# Patient Record
Sex: Male | Born: 1965 | Race: White | Hispanic: No | Marital: Married | Smoking: Former smoker
Health system: Southern US, Community
[De-identification: ages and names within clinical notes are randomized; demographics above are authoritative.]

## PROBLEM LIST (undated history)

## (undated) DIAGNOSIS — Z9889 Other specified postprocedural states: Secondary | ICD-10-CM

## (undated) DIAGNOSIS — J45909 Unspecified asthma, uncomplicated: Secondary | ICD-10-CM

## (undated) HISTORY — PX: APPENDECTOMY: SHX54

## (undated) HISTORY — PX: CATARACT EXTRACTION: SUR2

## (undated) HISTORY — PX: KNEE ARTHROSCOPY AND ARTHROTOMY: SUR84

---

## 2003-05-05 HISTORY — PX: HERNIA REPAIR: SHX51

## 2015-01-06 ENCOUNTER — Encounter (HOSPITAL_COMMUNITY)
Admission: EM | Disposition: A | Discharge status: Home / Self Care | Payer: Self-pay | Source: Home / Self Care | Attending: Emergency Medicine

## 2015-01-06 ENCOUNTER — Ambulatory Visit (HOSPITAL_COMMUNITY)
Admission: EM | Admit: 2015-01-06 | Discharge: 2015-01-06 | Disposition: A | Discharge status: Home / Self Care | Payer: Managed Care, Other (non HMO) | Attending: Emergency Medicine | Admitting: Emergency Medicine

## 2015-01-06 ENCOUNTER — Emergency Department (HOSPITAL_COMMUNITY): Payer: Managed Care, Other (non HMO)

## 2015-01-06 ENCOUNTER — Encounter (HOSPITAL_COMMUNITY): Payer: Self-pay | Admitting: Emergency Medicine

## 2015-01-06 DIAGNOSIS — T18128A Food in esophagus causing other injury, initial encounter: Secondary | ICD-10-CM | POA: Insufficient documentation

## 2015-01-06 DIAGNOSIS — R1012 Left upper quadrant pain: Secondary | ICD-10-CM | POA: Diagnosis not present

## 2015-01-06 DIAGNOSIS — K589 Irritable bowel syndrome without diarrhea: Secondary | ICD-10-CM | POA: Insufficient documentation

## 2015-01-06 DIAGNOSIS — J45909 Unspecified asthma, uncomplicated: Secondary | ICD-10-CM | POA: Insufficient documentation

## 2015-01-06 DIAGNOSIS — R0602 Shortness of breath: Secondary | ICD-10-CM | POA: Insufficient documentation

## 2015-01-06 DIAGNOSIS — Z7982 Long term (current) use of aspirin: Secondary | ICD-10-CM | POA: Diagnosis not present

## 2015-01-06 DIAGNOSIS — Z87891 Personal history of nicotine dependence: Secondary | ICD-10-CM | POA: Insufficient documentation

## 2015-01-06 DIAGNOSIS — Z79899 Other long term (current) drug therapy: Secondary | ICD-10-CM | POA: Diagnosis not present

## 2015-01-06 DIAGNOSIS — K222 Esophageal obstruction: Secondary | ICD-10-CM

## 2015-01-06 DIAGNOSIS — Z9889 Other specified postprocedural states: Secondary | ICD-10-CM

## 2015-01-06 DIAGNOSIS — Z880 Allergy status to penicillin: Secondary | ICD-10-CM | POA: Insufficient documentation

## 2015-01-06 DIAGNOSIS — R1310 Dysphagia, unspecified: Secondary | ICD-10-CM | POA: Diagnosis present

## 2015-01-06 DIAGNOSIS — F419 Anxiety disorder, unspecified: Secondary | ICD-10-CM | POA: Diagnosis not present

## 2015-01-06 HISTORY — DX: Other specified postprocedural states: Z98.890

## 2015-01-06 HISTORY — DX: Unspecified asthma, uncomplicated: J45.909

## 2015-01-06 HISTORY — PX: ESOPHAGOGASTRODUODENOSCOPY: SHX5428

## 2015-01-06 LAB — CBC WITH DIFFERENTIAL/PLATELET
BASOS ABS: 0.1 10*3/uL (ref 0.0–0.1)
Basophils Relative: 1 % (ref 0–1)
EOS ABS: 0.2 10*3/uL (ref 0.0–0.7)
EOS PCT: 3 % (ref 0–5)
HCT: 49 % (ref 39.0–52.0)
HEMOGLOBIN: 16.6 g/dL (ref 13.0–17.0)
LYMPHS PCT: 24 % (ref 12–46)
Lymphs Abs: 1.6 10*3/uL (ref 0.7–4.0)
MCH: 30 pg (ref 26.0–34.0)
MCHC: 33.9 g/dL (ref 30.0–36.0)
MCV: 88.6 fL (ref 78.0–100.0)
Monocytes Absolute: 0.9 10*3/uL (ref 0.1–1.0)
Monocytes Relative: 13 % — ABNORMAL HIGH (ref 3–12)
NEUTROS PCT: 59 % (ref 43–77)
Neutro Abs: 4.1 10*3/uL (ref 1.7–7.7)
PLATELETS: 264 10*3/uL (ref 150–400)
RBC: 5.53 MIL/uL (ref 4.22–5.81)
RDW: 13.6 % (ref 11.5–15.5)
WBC: 6.9 10*3/uL (ref 4.0–10.5)

## 2015-01-06 LAB — COMPREHENSIVE METABOLIC PANEL
ALT: 29 U/L (ref 17–63)
AST: 29 U/L (ref 15–41)
Albumin: 3.8 g/dL (ref 3.5–5.0)
Alkaline Phosphatase: 47 U/L (ref 38–126)
Anion gap: 5 (ref 5–15)
BILIRUBIN TOTAL: 0.5 mg/dL (ref 0.3–1.2)
BUN: 10 mg/dL (ref 6–20)
CHLORIDE: 99 mmol/L — AB (ref 101–111)
CO2: 32 mmol/L (ref 22–32)
Calcium: 9.1 mg/dL (ref 8.9–10.3)
Creatinine, Ser: 0.99 mg/dL (ref 0.61–1.24)
Glucose, Bld: 104 mg/dL — ABNORMAL HIGH (ref 65–99)
POTASSIUM: 4.3 mmol/L (ref 3.5–5.1)
Sodium: 136 mmol/L (ref 135–145)
TOTAL PROTEIN: 6.1 g/dL — AB (ref 6.5–8.1)

## 2015-01-06 LAB — LIPASE, BLOOD: LIPASE: 71 U/L — AB (ref 22–51)

## 2015-01-06 SURGERY — EGD (ESOPHAGOGASTRODUODENOSCOPY)
Anesthesia: Moderate Sedation

## 2015-01-06 MED ORDER — MIDAZOLAM HCL 5 MG/ML IJ SOLN
INTRAMUSCULAR | Status: AC
  Filled 2015-01-06: qty 2

## 2015-01-06 MED ORDER — SODIUM CHLORIDE 0.9 % IV BOLUS (SEPSIS)
1000.0000 mL | Freq: Once | INTRAVENOUS | Status: AC
  Administered 2015-01-06: 1000 mL via INTRAVENOUS

## 2015-01-06 MED ORDER — FENTANYL CITRATE (PF) 100 MCG/2ML IJ SOLN
INTRAMUSCULAR | Status: AC
  Filled 2015-01-06: qty 2

## 2015-01-06 MED ORDER — FENTANYL CITRATE (PF) 100 MCG/2ML IJ SOLN
INTRAMUSCULAR | Status: DC | PRN
  Administered 2015-01-06 (×3): 25 ug via INTRAVENOUS

## 2015-01-06 MED ORDER — GLUCAGON HCL RDNA (DIAGNOSTIC) 1 MG IJ SOLR
1.0000 mg | Freq: Once | INTRAMUSCULAR | Status: AC
  Administered 2015-01-06: 1 mg via INTRAVENOUS
  Filled 2015-01-06: qty 1

## 2015-01-06 MED ORDER — ONDANSETRON HCL 4 MG/2ML IJ SOLN
4.0000 mg | Freq: Once | INTRAMUSCULAR | Status: AC
  Administered 2015-01-06: 4 mg via INTRAVENOUS
  Filled 2015-01-06: qty 2

## 2015-01-06 MED ORDER — DIPHENHYDRAMINE HCL 50 MG/ML IJ SOLN
INTRAMUSCULAR | Status: AC
  Filled 2015-01-06: qty 1

## 2015-01-06 MED ORDER — MIDAZOLAM HCL 10 MG/2ML IJ SOLN
INTRAMUSCULAR | Status: DC | PRN
  Administered 2015-01-06 (×3): 2 mg via INTRAVENOUS
  Administered 2015-01-06: 1 mg via INTRAVENOUS

## 2015-01-06 MED ORDER — SODIUM CHLORIDE 0.9 % IV SOLN
Freq: Once | INTRAVENOUS | Status: AC
  Administered 2015-01-06: 04:00:00 via INTRAVENOUS

## 2015-01-06 MED ORDER — DIPHENHYDRAMINE HCL 50 MG/ML IJ SOLN
INTRAMUSCULAR | Status: DC | PRN
  Administered 2015-01-06: 25 mg via INTRAVENOUS

## 2015-01-06 NOTE — ED Notes (Signed)
Patient here with complaint of food stuck in esophagus. States history of Nissen procedure in 2005. Explains that he was eating steak tonight and didn't chew adequately. Since that time he can feel the food lodged and has been unable to pass liquids or solids. Currently states even saliva is "backing up in my food tube". Endorses shortness of breath and upper and left abdominal pain.

## 2015-01-06 NOTE — Consult Note (Signed)
Consultation  Referring Provider: Dr. Craig Staggers ED Primary Care Physician:  No primary care provider on file. Primary Gastroenterologist:  None, unassigned  Reason for Consultation:  Esophageal food impaction  HPI: Jay Jones is a 49 y.o. male with past medical history of GERD with hiatal hernia status post Nissen fundoplication in 2005 in [place], IBS, anxiety and history of asthma who presents to the emergency department with a steak food impaction. This started in the evening on 01/05/2015 while he was eating steak. He reports very rarely if he is eating fast or anxious having issues with solid food sticking in the lower esophagus. He has never required endoscopy to relieve this problem. He is usually able to eventually swallow the food down or vomit it back up. He denies heartburn. Mild epigastric pain currently but no chest pain or dyspnea. No nausea or vomiting. Recently relocated to [facility] from [place]. Since his Nissen surgery he has not required PPI therapy. He does endorse intermittent IBS symptoms which for him is alternating diarrhea and constipation particularly related to situation and diet. He denies blood in his stool or melena. He does occasionally use Naprosyn for joint pains. He uses Xanax rarely for anxiety.  He does report one upper endoscopy shortly after Nissen was performed for dilation because the wrap was "too tight".   Past Medical History  Diagnosis Date  . History of Nissen fundoplication   . Asthma     Past Surgical History  Procedure Laterality Date  . Appendectomy    . Knee arthroscopy and arthrotomy Bilateral     Prior to Admission medications   Medication Sig Start Date End Date Taking? Authorizing Provider  ALPRAZolam Prudy Feeler) 0.5 MG tablet Take 0.5 mg by mouth 3 (three) times daily as needed for anxiety.   Yes Historical Provider, MD  aspirin-acetaminophen-caffeine (EXCEDRIN MIGRAINE) [phone] MG per tablet  Take 2 tablets by mouth every 6 (six) hours as needed for headache.   Yes Historical Provider, MD  buPROPion (WELLBUTRIN SR) 200 MG 12 hr tablet Take 200 mg by mouth daily.   Yes Historical Provider, MD  naproxen sodium (ANAPROX) 220 MG tablet Take 220 mg by mouth 2 (two) times daily as needed (pain).   Yes Historical Provider, MD  NONFORMULARY OR COMPOUNDED ITEM Apply 4 drops topically daily. testosterone cream   Yes Historical Provider, MD    No current facility-administered medications for this encounter.   Current Outpatient Prescriptions  Medication Sig Dispense Refill  . ALPRAZolam (XANAX) 0.5 MG tablet Take 0.5 mg by mouth 3 (three) times daily as needed for anxiety.    Marland Kitchen aspirin-acetaminophen-caffeine (EXCEDRIN MIGRAINE) 250-250-65 MG per tablet Take 2 tablets by mouth every 6 (six) hours as needed for headache.    Marland Kitchen buPROPion (WELLBUTRIN SR) 200 MG 12 hr tablet Take 200 mg by mouth daily.    . naproxen sodium (ANAPROX) 220 MG tablet Take 220 mg by mouth 2 (two) times daily as needed (pain).    . NONFORMULARY OR COMPOUNDED ITEM Apply 4 drops topically daily. testosterone cream      Allergies as of 01/06/2015 - Review Complete 01/06/2015  Allergen Reaction Noted  . Penicillins Anaphylaxis 01/06/2015    History reviewed. No pertinent family history.  Social History   Social History  . Marital Status: Married    Spouse Name: N/A  . Number of Children: N/A  . Years of Education: N/A   Occupational History  . Not on file.  Social History Main Topics  . Smoking status: Former Games developer  . Smokeless tobacco: Not on file  . Alcohol Use: Yes  . Drug Use: No  . Sexual Activity: Not on file   Other Topics Concern  . Not on file   Social History Narrative  . No narrative on file    Review of Systems: As per history of present illness, otherwise negative  Physical Exam: Vital signs in last 24 hours: Temp:  [97.5 F (36.4 C)-98.7 F (37.1 C)] 98.4 F (36.9 C) (09/04  0643) Pulse Rate:  [67-91] 84 (09/04 0655) Resp:  [11-27] 15 (09/04 0655) BP: (122-158)/(84-108) 126/86 mmHg (09/04 0650) SpO2:  [95 %-99 %] 95 % (09/04 0655) Weight:  [185 lb (83.915 kg)] 185 lb (83.915 kg) (09/04 0626)   Gen: awake, alert, NAD HEENT: anicteric, op clear CV: RRR, no mrg Pulm: CTA b/l Abd: soft,mild epigastric tenderness,ND, +BS throughout Ext: no c/c/e Neuro: nonfocal   Lab Results:  Recent Labs  01/06/15 0238  WBC 6.9  HGB 16.6  HCT 49.0  PLT 264   BMET  Recent Labs  01/06/15 0238  NA 136  K 4.3  CL 99*  CO2 32  GLUCOSE 104*  BUN 10  CREATININE 0.99  CALCIUM 9.1   LFT  Recent Labs  01/06/15 0238  PROT 6.1*  ALBUMIN 3.8  AST 29  ALT 29  ALKPHOS 47  BILITOT 0.5    IMPRESSION:  49 year old with history of GERD and hiatal hernia repaired by Nissen fundal occasionally 2005 presenting with esophageal food impaction  PLAN: 1. Esophageal food impaction -- urgent upper endoscopy at bedside in the ER. The nature of the procedure, as well as the risks, benefits, and alternatives were carefully and thoroughly reviewed with the patient. Ample time for discussion and questions allowed. The patient understood, was satisfied, and agreed to proceed. May require dilation in the near future if significant GE junction narrowing or stenosis is found.     PYRTLE, JAY M  01/06/2015, 7:11 AM

## 2015-01-06 NOTE — Discharge Instructions (Signed)
Swallowed Foreign Body, Adult °You have swallowed an object (foreign body). The object may get stuck in the food pipe (esophagus). A stuck object can cause pain and trouble swallowing. In some cases, a doctor may need to remove the object. If the object keeps moving and reaches the stomach, it usually does not cause problems. Certain medicines and illegal drugs can be life-threatening if swallowed. °HOME CARE  °· Ask your doctor when it is safe for you to start eating again. Drink liquids and eat soft foods until you feel better. °· When you go back to eating normal foods: °¨ Cut food into small pieces. °¨ Remove small bones from food. °¨ Remove large seeds and pits from fruit. °¨ Chew your food well. °¨ Do not talk, laugh, or do physical activity while eating or swallowing. °GET HELP RIGHT AWAY IF: °· You have trouble breathing, chest pain, or you cannot control your cough. °· You have a temperature by mouth above 102° F (38.9° C), not controlled by medicine. °· You cannot eat or drink, or you feel food is stuck in your throat. °· You are choking, or you cannot stop drooling. °· You have belly (abdominal) pain, you throw up (vomit) blood, or you are bleeding from the place where poop comes out (rectum). °MAKE SURE YOU: °· Understand these instructions. °· Will watch your condition. °· Will get help right away if you are not doing well or get worse. °Document Released: 08/05/2010 Document Revised: 07/13/2011 Document Reviewed: 08/05/2010 °ExitCare® Patient Information ©2015 [employer]. This information is not intended to replace advice given to you by your health care provider. Make sure you discuss any questions you have with your health care provider. ° °

## 2015-01-06 NOTE — ED Provider Notes (Signed)
CSN: [id number]   Arrival date & time 01/06/15 6578  History  This chart was scribed for Jay Loveless, MD by Bethel Born, ED Scribe. This patient was seen in room D30C/D30C and the patient's care was started at 2:12 AM.  Chief Complaint  Patient presents with  . Food Intolerance    HPI The history is provided by the patient. No language interpreter was used.   XAIDEN Jones is a 49 y.o. male with PMHx of Nissen fundoplication  who presents to the Emergency Department complaining of a piece of steak lodged in his throat with onset around 8 PM. Pt states that since the steak got stuck he has been unable to swallow anything including his own secretions.  Associated symptoms include left upper abdominal pain that he describes as pressure and nausea. Pt denies SOB but notes that the abdominal pressure is making breathing uncomfortable. He had the Nissen procedure in 2005 because of severe acid reflux. He recently moved to the area and has no GI follow up.   Past Medical History  Diagnosis Date  . History of Nissen fundoplication   . Asthma     Past Surgical History  Procedure Laterality Date  . Appendectomy    . Knee arthroscopy and arthrotomy Bilateral     No family history on file.  Social History  Substance Use Topics  . Smoking status: Former Games developer  . Smokeless tobacco: None  . Alcohol Use: Yes     Review of Systems  Respiratory: Negative for shortness of breath.   Gastrointestinal: Positive for nausea and abdominal pain.       Steak lodged in throat  All other systems reviewed and are negative.   Home Medications   Prior to Admission medications   Medication Sig Start Date End Date Taking? Authorizing Provider  ALPRAZolam Prudy Feeler) 0.5 MG tablet Take 0.5 mg by mouth 3 (three) times daily as needed for anxiety.   Yes Historical Provider, MD  aspirin-acetaminophen-caffeine (EXCEDRIN MIGRAINE) [phone] MG per tablet Take 2 tablets by mouth every 6 (six) hours as  needed for headache.   Yes Historical Provider, MD  buPROPion (WELLBUTRIN SR) 200 MG 12 hr tablet Take 200 mg by mouth daily.   Yes Historical Provider, MD  naproxen sodium (ANAPROX) 220 MG tablet Take 220 mg by mouth 2 (two) times daily as needed (pain).   Yes Historical Provider, MD  NONFORMULARY OR COMPOUNDED ITEM Apply 4 drops topically daily. testosterone cream   Yes Historical Provider, MD    Allergies  Penicillins  Triage Vitals: BP 136/100 mmHg  Pulse 86  Temp(Src) 98.7 F (37.1 C) (Oral)  Resp 20  SpO2 97%  Physical Exam  Constitutional: He is oriented to person, place, and time. He appears well-developed and well-nourished.  HENT:  Head: Normocephalic and atraumatic.  Right Ear: External ear normal.  Left Ear: External ear normal.  Nose: Nose normal.  Mouth/Throat: Oropharynx is clear and moist.  Eyes: Right eye exhibits no discharge. Left eye exhibits no discharge.  Neck: Neck supple.  Cardiovascular: Normal rate, regular rhythm, normal heart sounds and intact distal pulses.   Pulmonary/Chest: Effort normal and breath sounds normal.  Abdominal: Soft. There is tenderness.  Moderate LUQ tenderness  Musculoskeletal: He exhibits no edema.  Neurological: He is alert and oriented to person, place, and time.  Skin: Skin is warm and dry.  Nursing note and vitals reviewed.   ED Course  Procedures   DIAGNOSTIC STUDIES: Oxygen Saturation is 97% on RA,  normal by my interpretation.    COORDINATION OF CARE: 2:16 AM Discussed treatment plan which includes CXR, lab work, Zofran, and IVF with pt at bedside and pt agreed to plan.  3:16 AM-Consult complete with Dr. Rhea Belton. Patient case explained and discussed. Call ended at 3:17 AM.  3:17 AM I re-evaluated the patient and provided an update on the results of his testing as well as my conversation with GI.    Labs Reviewed  COMPREHENSIVE METABOLIC PANEL - Abnormal; Notable for the following:    Chloride 99 (*)    Glucose,  Bld 104 (*)    Total Protein 6.1 (*)    All other components within normal limits  CBC WITH DIFFERENTIAL/PLATELET - Abnormal; Notable for the following:    Monocytes Relative 13 (*)    All other components within normal limits  LIPASE, BLOOD - Abnormal; Notable for the following:    Lipase 71 (*)    All other components within normal limits    I, Jay Loveless, MD, personally reviewed and evaluated these images and lab results as part of my medical decision-making.  Imaging Review Dg Chest Portable 1 View  01/06/2015   CLINICAL DATA:  Feels food lodged in esophagus after eating steak. Shortness of breath and left upper quadrant abdominal pain. Initial encounter.  EXAM: PORTABLE CHEST - 1 VIEW  COMPARISON:  None.  FINDINGS: The lungs are well-aerated and clear. There is no evidence of focal opacification, pleural effusion or pneumothorax.  The cardiomediastinal silhouette is within normal limits. No acute osseous abnormalities are seen.  A piece of steak would not be visible on radiograph.  IMPRESSION: No acute cardiopulmonary process seen. Note that a piece of steak would not be visible on radiograph.   Electronically Signed   By: Roanna Raider M.D.   On: 01/06/2015 02:57       MDM   Final diagnoses:  Food impaction of esophagus, initial encounter   Patient with food impaction from steak. Airway intact and stable. Glucagon attempted without relief. D/w GI, Dr. Rhea Belton, who came and removed by EGD. No esophageal abnormalities besides massive piece of steak. D/c with outpatient follow up when awake from sedation.   I personally performed the services described in this documentation, which was scribed in my presence. The recorded information has been reviewed and is accurate.   Jay Loveless, MD 01/06/15 [phone]

## 2015-01-06 NOTE — Op Note (Signed)
[facility] [address] [place], [address]   ENDOSCOPY PROCEDURE REPORT  PATIENT: Jay Jones, Jay Jones  MR#: #[id number] BIRTHDATE: 09-10-1965 , 49  yrs. old GENDER: male ENDOSCOPIST: Beverley Fiedler, MD REFERRED BY:   Dr. Criss Alvine, Emergency Room at [facility] PROCEDURE DATE:  01/06/2015 PROCEDURE:  EGD with foreign body removal ASA CLASS:     Class II INDICATIONS:  dysphagia after esophageal food impaction, history of Nissen fundoplication MEDICATIONS: Benadryl 25 mg IV, Fentanyl 75 mcg IV, and Versed 7 mg IV TOPICAL ANESTHETIC: Cetacaine Spray  DESCRIPTION OF PROCEDURE: After the risks benefits and alternatives of the procedure were thoroughly explained, informed consent was obtained.  The Pentax Gastroscope [id number] endoscope was introduced through the mouth and advanced to the second portion of the duodenum , Without limitations.  The instrument was slowly withdrawn as the mucosa was fully examined.   ESOPHAGUS: There was a large meat impaction found in the lower third of the esophagus.  The endoscope was able to gently traverse around the meat impaction and the stomach.  The meat impaction was unable to be advanced with gentle pressure, thus a Lucina Mellow net was used to remove a very large portion of meat.  On re-look a very small amount of retained food was able to be easily advanced into the stomach. On full inspection, the mucosa of the esophagus appeared normal.  No stricture or other lesions seen. Food impaction felt secondary to swallowing a food bolus that was simply too large.  STOMACH: A Nissen fundoplication was found characterized as healthy in appearance.  The site was intact.   The mucosa of the stomach appeared normal.  DUODENUM: The duodenal mucosa showed no abnormalities in the bulb and 2nd part of the duodenum.  Retroflexed views revealed as previously described.     The scope was then withdrawn from the patient and the procedure  completed.  COMPLICATIONS: There were no immediate complications.  ENDOSCOPIC IMPRESSION: 1.   There was a meat impaction found in the lower third of the esophagus, majority removed with Lucina Mellow net, remainder easily advanced into the stomach 2.   The mucosa of the esophagus appeared normal 3.   Nissen fundoplication was found which is intact 4.   The mucosa of the stomach appeared normal 5.   The duodenal mucosa showed no abnormalities in the bulb and 2nd part of the duodenum  RECOMMENDATIONS: 1.  Chew food well, take small bites, and avoid eating fast 2.  GI office follow-up recommended for an persistent or worsening with trouble swallowing  eSigned:  Beverley Fiedler, MD 01/06/2015 8:08 AM    CC: the patient  PATIENT NAME:  [name], [name] MR#: #[id number]

## 2015-02-11 ENCOUNTER — Other Ambulatory Visit: Payer: Self-pay | Admitting: Orthopedic Surgery

## 2015-02-11 DIAGNOSIS — R52 Pain, unspecified: Secondary | ICD-10-CM

## 2015-02-18 ENCOUNTER — Ambulatory Visit (INDEPENDENT_AMBULATORY_CARE_PROVIDER_SITE_OTHER): Payer: Managed Care, Other (non HMO) | Admitting: Internal Medicine

## 2015-02-18 ENCOUNTER — Encounter: Payer: Self-pay | Admitting: Internal Medicine

## 2015-02-18 VITALS — BP 128/86 | HR 96 | Temp 98.8°F | Resp 16 | Ht 71.0 in | Wt 188.0 lb

## 2015-02-18 DIAGNOSIS — J302 Other seasonal allergic rhinitis: Secondary | ICD-10-CM | POA: Diagnosis not present

## 2015-02-18 DIAGNOSIS — F329 Major depressive disorder, single episode, unspecified: Secondary | ICD-10-CM | POA: Diagnosis not present

## 2015-02-18 DIAGNOSIS — M75102 Unspecified rotator cuff tear or rupture of left shoulder, not specified as traumatic: Secondary | ICD-10-CM

## 2015-02-18 DIAGNOSIS — F411 Generalized anxiety disorder: Secondary | ICD-10-CM | POA: Diagnosis not present

## 2015-02-18 DIAGNOSIS — F32A Depression, unspecified: Secondary | ICD-10-CM | POA: Insufficient documentation

## 2015-02-18 DIAGNOSIS — M5430 Sciatica, unspecified side: Secondary | ICD-10-CM

## 2015-02-18 DIAGNOSIS — E291 Testicular hypofunction: Secondary | ICD-10-CM

## 2015-02-18 DIAGNOSIS — E349 Endocrine disorder, unspecified: Secondary | ICD-10-CM | POA: Insufficient documentation

## 2015-02-18 NOTE — Assessment & Plan Note (Signed)
Chronic, intermittent Takes aleve as needed

## 2015-02-18 NOTE — Progress Notes (Signed)
Pre visit review using our clinic review tool, if applicable. No additional management support is needed unless otherwise documented below in the visit note. 

## 2015-02-18 NOTE — Patient Instructions (Addendum)
It was nice to meet you.  All other Health Maintenance issues reviewed.   All recommended immunizations and age-appropriate screenings are up-to-date.  No immunizations administered today.   Medications reviewed and updated.  No changes recommended at this time.

## 2015-02-18 NOTE — Progress Notes (Signed)
Subjective:    Patient ID: Jay Jones, male    DOB: 05/14/1965, 49 y.o.   MRN: [id number]  HPI  He is here to establish with a new pcp and follow up of depression/axiety, testosterone deficiency, allergies.   Seasonal allergies:  He is taking allegra, singulair and albuterol daily.  He has tried flonase and had bloody noses so does not use that.  He is using a nasal saline spray.  He has sinus pain and congestion, but denies pnd.  He has treid a decongestant, but has side effects from it and does not tolerate it.  He is also doing raw honey.  He knows if this all does not work he may need to see an Proofreaderallergist.  Testosterone deficiency:  He takes testosterone cream.  He had been on injections weekly previously, but is now on a cream because he moved and it is not convenient to go get a shot weekly.  The monthly injections did not last the whole month.  His insurance will not pay for the pellets.  He feels much better on the cream.  His psa and prostate exam have been normal.  He was following with endocrine where he used to live.    With the testosterone his RBCs go up and he was told by the specialist to donate his blood every 8 weeks, which he still does.   Chronic anxiety, depression:  He takes wellbutrin daily.  His depression and anxiety are related to stress.  His wife has a lot of medical problems.  He is tolerating the medication well and feels the dose is good.  He has used xanax as needed, but has not needed it recently.  He would like to avoid the xanax.    He has choric intermittent back pain. He has sciatica like pain with radiating down his legs.  He takes aleve as needed.   Medications and allergies reviewed with patient and updated if appropriate.  Patient Active Problem List   Diagnosis Date Noted  . Sciatica 02/18/2015  . Depression 02/18/2015  . Anxiety state 02/18/2015  . Left rotator cuff tear 02/18/2015  . Testosterone deficiency 02/18/2015  . Other  seasonal allergic rhinitis 02/18/2015  . Food impaction of esophagus     Past Medical History  Diagnosis Date  . History of Nissen fundoplication   . Asthma     Past Surgical History  Procedure Laterality Date  . Appendectomy    . Knee arthroscopy and arthrotomy Bilateral   . Esophagogastroduodenoscopy N/A 01/06/2015    Procedure: ESOPHAGOGASTRODUODENOSCOPY (EGD);  Surgeon: Beverley FiedlerJay M Pyrtle, MD;  [facility];  Service: Gastroenterology;  Laterality: N/A;    Social History   Social History  . Marital Status: Married    Spouse Name: N/A  . Number of Children: N/A  . Years of Education: N/A   Social History Main Topics  . Smoking status: Former Games developermoker  . Smokeless tobacco: None  . Alcohol Use: Yes  . Drug Use: No  . Sexual Activity: Not Asked   Other Topics Concern  . None   Social History Narrative    Review of Systems  Constitutional: Negative for fever, chills, appetite change and unexpected weight change.  Respiratory: Positive for wheezing. Negative for cough and shortness of breath.        Wheezing from allergies  Cardiovascular: Negative.   Gastrointestinal: Positive for abdominal pain (cramping). Negative for blood in stool.       Constipation -  diarrhea episodes, no GERD  Genitourinary: Negative for dysuria and difficulty urinating.  Musculoskeletal: Positive for back pain and arthralgias.  Neurological: Positive for headaches (from allergies, occasional migraines). Negative for dizziness and light-headedness.       Objective:   Filed Vitals:   02/18/15 1010  BP: 128/86  Pulse: 96  Temp: 98.8 F (37.1 C)  Resp: 16   Filed Weights   02/18/15 1010  Weight: 188 lb (85.276 kg)   Body mass index is 26.23 kg/(m^2).   Physical Exam  Constitutional: He is oriented to person, place, and time. He appears well-developed and well-nourished. No distress.  HENT:  Head: Normocephalic and atraumatic.  Right Ear: External ear normal.  Left Ear:  External ear normal.  Mouth/Throat: Oropharynx is clear and moist.  Eyes: Conjunctivae are normal.  Neck: Neck supple. No tracheal deviation present. No thyromegaly present.  No carotid bruit  Cardiovascular: Normal rate, regular rhythm and normal heart sounds.   No murmur heard. Pulmonary/Chest: Effort normal and breath sounds normal. No respiratory distress. He has no wheezes.  Abdominal: Soft. Bowel sounds are normal. He exhibits no distension. There is no tenderness.  Musculoskeletal: He exhibits no edema.  Lymphadenopathy:    He has no cervical adenopathy.  Neurological: He is oriented to person, place, and time.  Skin: Skin is warm and dry. No rash noted.  Psychiatric: He has a normal mood and affect. His behavior is normal.        Assessment & Plan:   See Problem List.  Will check blood work for testosterone def as well as other routine screening blood work  Pincus Sanes, MD

## 2015-02-18 NOTE — Assessment & Plan Note (Signed)
Stable and controlled Taking low dose wellbutrin daily  Continue wellbutrin at current dose

## 2015-02-18 NOTE — Assessment & Plan Note (Signed)
Continue allegra, singulair, albuterol as needed Continue saline nasal spray Can retry flonase or other nasal steroid spray Trying more natural remedies Will call if he wants to be referred to allergy

## 2015-02-18 NOTE — Assessment & Plan Note (Signed)
Stable and controlled Agree with avoiding xanax - he understands the addiction potential Taking low dose wellbutrin daily  Continue wellbutrin at current dose

## 2015-02-18 NOTE — Assessment & Plan Note (Signed)
Currently using a bio-identical cream and wants to stay with that for now Will check testosterone level, psa, fsh Should ultimately establish with urology or endocrine

## 2015-02-19 ENCOUNTER — Encounter: Payer: Self-pay | Admitting: Internal Medicine

## 2015-02-19 MED ORDER — ALPRAZOLAM 0.5 MG PO TABS: 0.5000 mg | ORAL_TABLET | Freq: Two times a day (BID) | ORAL | Status: DC | PRN

## 2015-02-19 NOTE — Telephone Encounter (Signed)
Prescription printed

## 2015-02-19 NOTE — Telephone Encounter (Signed)
Pt email is requesting refill for alprazolam.

## 2015-02-20 ENCOUNTER — Other Ambulatory Visit (INDEPENDENT_AMBULATORY_CARE_PROVIDER_SITE_OTHER): Payer: Managed Care, Other (non HMO)

## 2015-02-20 DIAGNOSIS — E349 Endocrine disorder, unspecified: Secondary | ICD-10-CM

## 2015-02-20 DIAGNOSIS — E291 Testicular hypofunction: Secondary | ICD-10-CM | POA: Diagnosis not present

## 2015-02-20 LAB — LIPID PANEL
CHOL/HDL RATIO: 3
Cholesterol: 196 mg/dL (ref 0–200)
HDL: 78.1 mg/dL (ref >39.00)
LDL Cholesterol: 94 mg/dL (ref 0–99)
NONHDL: 118.19
Triglycerides: 121 mg/dL (ref 0.0–149.0)
VLDL: 24.2 mg/dL (ref 0.0–40.0)

## 2015-02-20 LAB — FOLLICLE STIMULATING HORMONE: FSH: 4.7 m[IU]/mL (ref 1.4–18.1)

## 2015-02-20 LAB — T4, FREE: FREE T4: 0.65 ng/dL (ref 0.60–1.60)

## 2015-02-20 LAB — TSH: TSH: 1.6 u[IU]/mL (ref 0.35–4.50)

## 2015-02-21 ENCOUNTER — Encounter: Payer: Self-pay | Admitting: Internal Medicine

## 2015-02-21 LAB — PSA, TOTAL AND FREE: PSA: <0.01 ng/mL (ref <=4.00)

## 2015-02-21 LAB — TESTOSTERONE, FREE, TOTAL, SHBG
Sex Hormone Binding: 47 nmol/L (ref 10–50)
TESTOSTERONE FREE: 7.1 pg/mL — AB (ref 47.0–244.0)
TESTOSTERONE-% FREE: 1.4 % — AB (ref 1.6–2.9)
Testosterone: 49 ng/dL — ABNORMAL LOW (ref 300–890)

## 2015-02-27 ENCOUNTER — Telehealth: Payer: Self-pay | Admitting: Internal Medicine

## 2015-02-27 NOTE — Telephone Encounter (Signed)
Rec'd from [facility] forward 20 pages to Dr. Lawerance BachBurns

## 2015-02-28 ENCOUNTER — Ambulatory Visit
Admission: RE | Admit: 2015-02-28 | Discharge: 2015-02-28 | Disposition: A | Discharge status: Home / Self Care | Payer: Managed Care, Other (non HMO) | Source: Ambulatory Visit | Attending: Orthopedic Surgery | Admitting: Orthopedic Surgery

## 2015-02-28 DIAGNOSIS — R52 Pain, unspecified: Secondary | ICD-10-CM

## 2015-03-12 ENCOUNTER — Encounter: Payer: Self-pay | Admitting: Internal Medicine

## 2015-03-12 MED ORDER — MONTELUKAST SODIUM 10 MG PO TABS: 10.0000 mg | ORAL_TABLET | Freq: Every day | ORAL | Status: AC

## 2015-03-22 ENCOUNTER — Other Ambulatory Visit: Payer: Self-pay | Admitting: Internal Medicine

## 2015-03-27 ENCOUNTER — Encounter: Payer: Self-pay | Admitting: Internal Medicine

## 2015-03-27 ENCOUNTER — Other Ambulatory Visit: Payer: Self-pay | Admitting: Emergency Medicine

## 2015-03-27 MED ORDER — ALPRAZOLAM 0.5 MG PO TABS: 0.5000 mg | ORAL_TABLET | Freq: Two times a day (BID) | ORAL | Status: DC | PRN

## 2015-03-27 NOTE — Telephone Encounter (Signed)
Refill for Xanax has been faxed to [facility]

## 2015-05-09 ENCOUNTER — Other Ambulatory Visit: Payer: Self-pay | Admitting: Internal Medicine

## 2015-05-09 DIAGNOSIS — R7989 Other specified abnormal findings of blood chemistry: Secondary | ICD-10-CM

## 2015-05-16 NOTE — Telephone Encounter (Signed)
Pt called in and wanted to check on this status of the refill .  He also needs a referral to endocrinology.  His testosterone is extremely low and needs a referral to endo asap.   He also said the reason for the med is that he was laid off a few month ago and he is really stressed.Marland Kitchen.    Best number [phone] Pharmacy [facility] [address]

## 2015-05-16 NOTE — Telephone Encounter (Signed)
Please advise. RX refill printed, waiting on MDs signature to fax

## 2015-05-16 NOTE — Telephone Encounter (Signed)
Referral ordered

## 2015-05-17 NOTE — Telephone Encounter (Signed)
RX was faxed on 05/16/15. Clarified with Pharm that rx was received.

## 2015-05-31 ENCOUNTER — Encounter: Payer: Self-pay | Admitting: Endocrinology

## 2015-05-31 ENCOUNTER — Ambulatory Visit (INDEPENDENT_AMBULATORY_CARE_PROVIDER_SITE_OTHER): Payer: Managed Care, Other (non HMO) | Admitting: Endocrinology

## 2015-05-31 VITALS — BP 112/82 | HR 82 | Temp 97.9°F | Ht 71.0 in | Wt 194.0 lb

## 2015-05-31 DIAGNOSIS — E291 Testicular hypofunction: Secondary | ICD-10-CM

## 2015-05-31 DIAGNOSIS — E349 Endocrine disorder, unspecified: Secondary | ICD-10-CM

## 2015-05-31 LAB — LUTEINIZING HORMONE: LH: 2.76 m[IU]/mL (ref 1.50–9.30)

## 2015-05-31 NOTE — Progress Notes (Signed)
Subjective:    Patient ID: Jay Jones, male    DOB: Sep 11, 1965, 50 y.o.   MRN: [id number]  HPI Pt reports he had puberty at the normal age.  He has 2 biological children.  His wife has had TAH.  He says he was dx'ed with hypogonadism in 2013, when he had a testosterone of approx 365.  He first took injected testosterone x approx 18 months.  He then changed to topical compounded testosterone, until he ran out in Nov of 2016.  He does not take antiandrogens or opioids.  He denies any h/o infertility, XRT, or genital infection.  He has never had surgery, or a serious injury to the head or genital area.  He does not consume alcohol excessively.  He reports moderate cold intolerance throughout the body, and assoc fatigue.   Past Medical History  Diagnosis Date  . History of Nissen fundoplication   . Asthma     Past Surgical History  Procedure Laterality Date  . Appendectomy    . Knee arthroscopy and arthrotomy Bilateral   . Esophagogastroduodenoscopy N/A 01/06/2015    Procedure: ESOPHAGOGASTRODUODENOSCOPY (EGD);  Surgeon: Beverley Fiedler, MD;  Location: [facility] ENDOSCOPY;  Service: Gastroenterology;  Laterality: N/A;  . Cataract extraction    . Hernia repair  2005    Social History   Social History  . Marital Status: Married    Spouse Name: N/A  . Number of Children: N/A  . Years of Education: N/A   Occupational History  . Not on file.   Social History Main Topics  . Smoking status: Former Games developer  . Smokeless tobacco: Not on file  . Alcohol Use: Yes  . Drug Use: No  . Sexual Activity: Not on file   Other Topics Concern  . Not on file   Social History Narrative    Current Outpatient Prescriptions on File Prior to Visit  Medication Sig Dispense Refill  . ALPRAZolam (XANAX) 0.5 MG tablet TAKE 1 TABLET BY MOUTH TWICE A DAY AS NEEDED FOR ANXIETY 20 tablet 0  . aspirin-acetaminophen-caffeine (EXCEDRIN MIGRAINE) 250-250-65 MG per tablet Take 2 tablets by mouth every 6 (six)  hours as needed for headache.    Marland Kitchen buPROPion (WELLBUTRIN SR) 200 MG 12 hr tablet Take 200 mg by mouth daily.    Marland Kitchen Fexofenadine HCl (ALLEGRA PO) Take by mouth as needed.    . montelukast (SINGULAIR) 10 MG tablet Take 1 tablet (10 mg total) by mouth at bedtime. 90 tablet 3  . naproxen sodium (ANAPROX) 220 MG tablet Take 220 mg by mouth 2 (two) times daily as needed (pain).    . VENTOLIN HFA 108 (90 BASE) MCG/ACT inhaler TAKE 2 PUFFS BY MOUTH EVERY 6 HOURS AS NEEDED FOR WHEEZING  0   No current facility-administered medications on file prior to visit.    Allergies  Allergen Reactions  . Penicillins Anaphylaxis    Family History  Problem Relation Age of Onset  . Diabetes Mother   . Cancer Mother   . Diabetes Father   . Hypertension Father   . Hyperlipidemia Father   . Other Neg Hx     hypogonadism    BP 112/82 mmHg  Pulse 82  Temp(Src) 97.9 F (36.6 C) (Oral)  Ht  (1.803 m)  Wt 194 lb (87.998 kg)  BMI 27.07 kg/m2  SpO2 97%     Review of Systems denies numbness, weight change, decreased urinary stream, gynecomastia, fever, headache, easy bruising, sob, rash, blurry  vision, rhinorrhea, chest pain.  He has depression, muscle weakness, and ED sxs.       Objective:   Physical Exam VS: see vs page GEN: no distress HEAD: head: no deformity eyes: no periorbital swelling, no proptosis external nose and ears are normal mouth: no lesion seen NECK: supple, thyroid is not enlarged CHEST WALL: no deformity LUNGS: clear to auscultation BREASTS:  No gynecomastia CV: reg rate and rhythm, no murmur ABD: abdomen is soft, nontender.  no hepatosplenomegaly.  not distended.  no hernia GENITALIA:  Normal male.   MUSCULOSKELETAL: muscle bulk and strength are grossly normal.  no obvious joint swelling.  gait is normal and steady EXTEMITIES: no deformity.  no ulcer on the feet.  feet are of normal color and temp.  no edema PULSES: dorsalis pedis intact bilat.  no carotid  bruit NEURO:  cn 2-12 grossly intact.   readily moves all 4's.  sensation is intact to touch on the feet SKIN:  Normal texture and temperature.  No rash or suspicious lesion is visible.   NODES:  None palpable at the neck PSYCH: alert, well-oriented.  Does not appear anxious nor depressed.   Lab Results  Component Value Date   TESTOSTERONE 49* 02/20/2015   I have reviewed outside records, and summarized: Pt was noted to have low testosterone, and referred here.      Assessment & Plan:  Hypogonadism, new to me, uncertain etiology.   Cold intolerance.  Unlikely testosterone-related   Patient is advised the following: Patient Instructions  blood tests are requested for you today.  We'll let you know about the results.  Depending on the results, may be able to prescribe for you a pill for this.   Testosterone treatment has risks, including increased or decreased fertility (depending on the type of treatment), hair loss, prostate cancer, benign prostate enlargement, blood clots, liver problems, lower hdl ("good cholesterol"), polycythemia (opposite of anemia), sleep apnea, and behavior changes.

## 2015-05-31 NOTE — Patient Instructions (Addendum)
blood tests are requested for you today.  We'll let you know about the results.  Depending on the results, may be able to prescribe for you a pill for this.   Testosterone treatment has risks, including increased or decreased fertility (depending on the type of treatment), hair loss, prostate cancer, benign prostate enlargement, blood clots, liver problems, lower hdl ("good cholesterol"), polycythemia (opposite of anemia), sleep apnea, and behavior changes.

## 2015-06-02 LAB — TESTOSTERONE,FREE AND TOTAL
TESTOSTERONE: 216 ng/dL — AB (ref 348–1197)
Testosterone, Free: 4.6 pg/mL — ABNORMAL LOW (ref 6.8–21.5)

## 2015-06-05 LAB — ESTRADIOL, FREE
ESTRADIOL FREE: 0.28 pg/mL (ref <=0.45)
Estradiol: 13 pg/mL (ref <=29)

## 2015-06-07 ENCOUNTER — Other Ambulatory Visit: Payer: Self-pay | Admitting: Endocrinology

## 2015-06-07 ENCOUNTER — Encounter: Payer: Self-pay | Admitting: Endocrinology

## 2015-06-07 DIAGNOSIS — E349 Endocrine disorder, unspecified: Secondary | ICD-10-CM

## 2015-06-07 MED ORDER — CLOMIPHENE CITRATE 50 MG PO TABS: ORAL_TABLET | ORAL | Status: AC

## 2017-07-09 IMAGING — CR DG CHEST 1V PORT
1 series · 1 of 1 positions shown · non-contrast
Comparison: None.

CLINICAL DATA: Feels food lodged in esophagus after eating steak.
Shortness of breath and left upper quadrant abdominal pain. Initial
encounter.

EXAM:
PORTABLE CHEST - 1 VIEW

[AP]
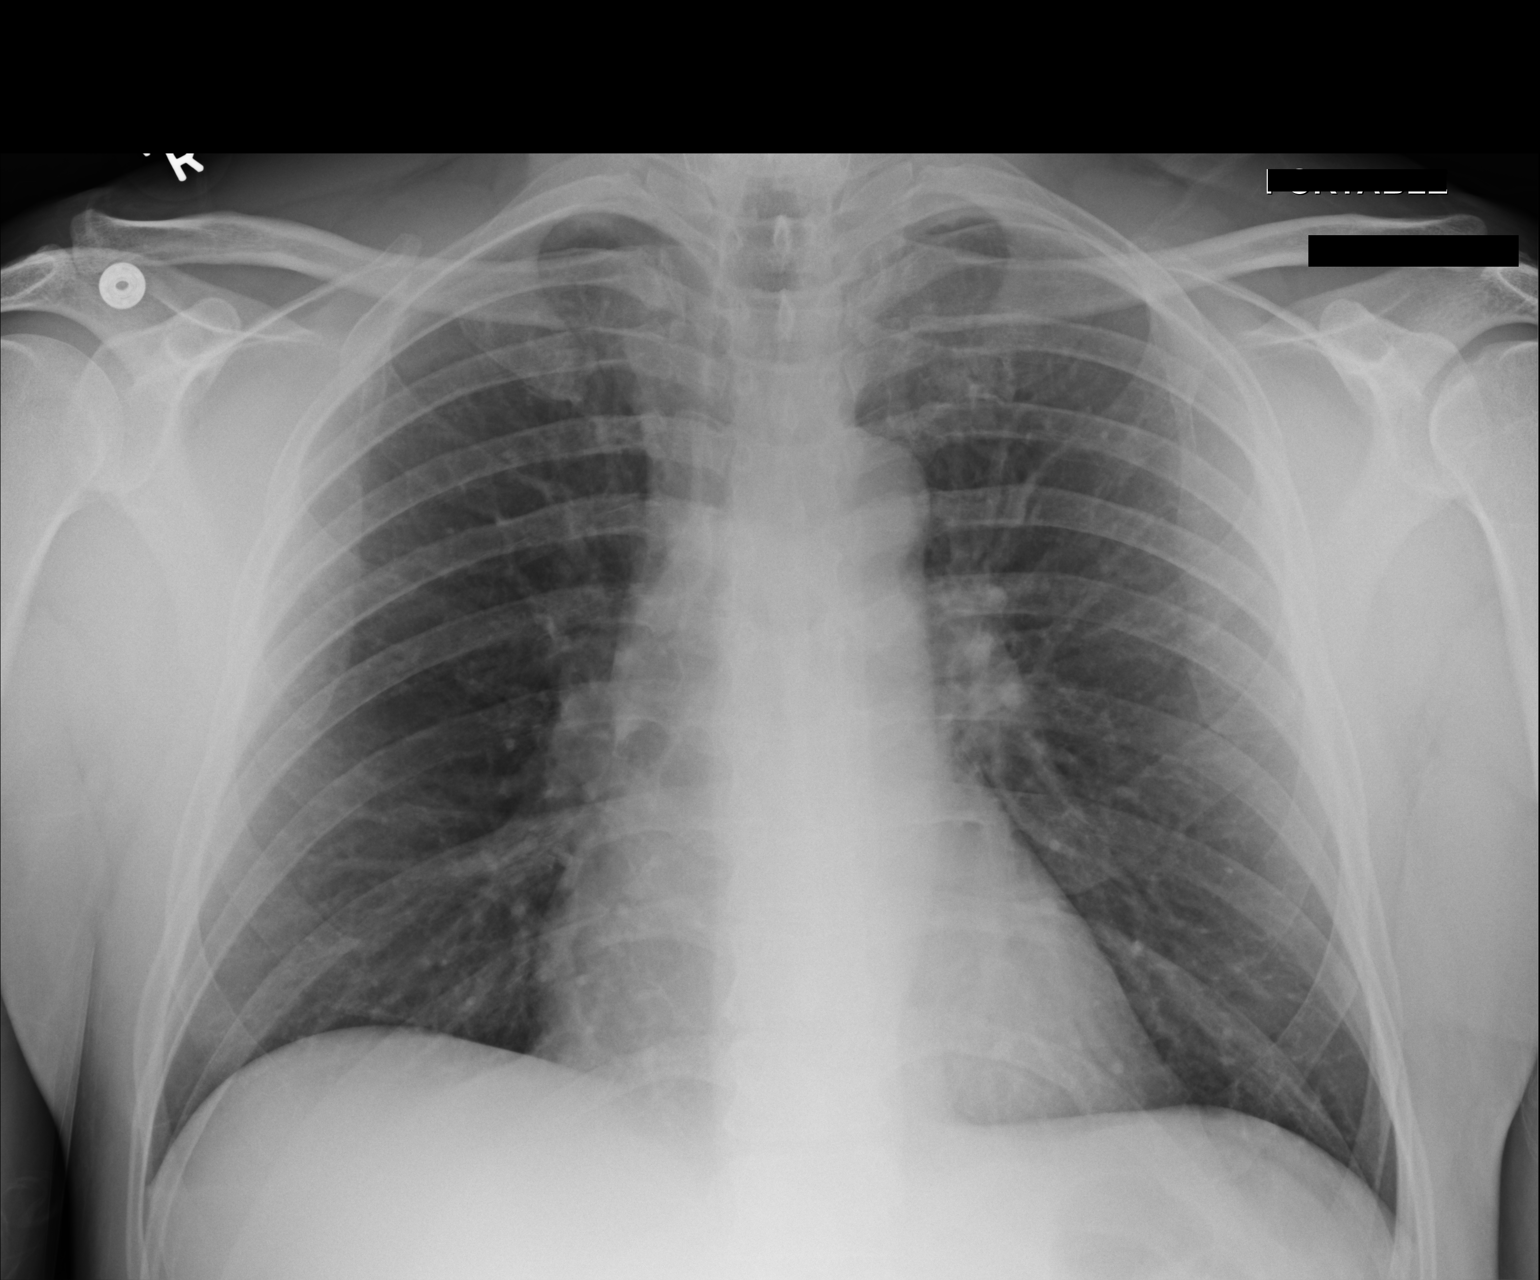

[1 of 1 positions shown; findings below may reference images not displayed]

FINDINGS: The lungs are well-aerated and clear. There is no evidence of focal
opacification, pleural effusion or pneumothorax.

The cardiomediastinal silhouette is within normal limits. No acute
osseous abnormalities are seen.

A piece of steak would not be visible on radiograph.
IMPRESSION: No acute cardiopulmonary process seen. Note that a piece of steak
would not be visible on radiograph.
